# Patient Record
Sex: Male | Born: 1985
Health system: Southern US, Community
[De-identification: ages and names within clinical notes are randomized; demographics above are authoritative.]

## PROBLEM LIST (undated history)

## (undated) DIAGNOSIS — I10 Essential (primary) hypertension: Secondary | ICD-10-CM

## (undated) DIAGNOSIS — F419 Anxiety disorder, unspecified: Secondary | ICD-10-CM

## (undated) DIAGNOSIS — F32A Depression, unspecified: Secondary | ICD-10-CM

## (undated) HISTORY — DX: Essential (primary) hypertension: I10

## (undated) HISTORY — DX: Depression, unspecified: F32.A

## (undated) HISTORY — DX: Anxiety disorder, unspecified: F41.9

---
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
## 2005-10-04 ENCOUNTER — Ambulatory Visit: Payer: Self-pay | Admitting: Internal Medicine

## 2007-03-30 ENCOUNTER — Ambulatory Visit: Payer: Self-pay | Admitting: Internal Medicine

## 2007-03-30 DIAGNOSIS — J019 Acute sinusitis, unspecified: Secondary | ICD-10-CM | POA: Insufficient documentation

## 2007-03-30 LAB — CONVERTED CEMR LAB: Rapid Strep: NEGATIVE

## 2007-05-05 DIAGNOSIS — S060XAA Concussion with loss of consciousness status unknown, initial encounter: Secondary | ICD-10-CM | POA: Insufficient documentation

## 2007-05-05 DIAGNOSIS — S060X9A Concussion with loss of consciousness of unspecified duration, initial encounter: Secondary | ICD-10-CM | POA: Insufficient documentation

## 2007-06-04 ENCOUNTER — Ambulatory Visit: Payer: Self-pay | Admitting: Internal Medicine

## 2007-06-05 ENCOUNTER — Ambulatory Visit (HOSPITAL_COMMUNITY): Admission: RE | Admit: 2007-06-05 | Discharge: 2007-06-05 | Payer: Self-pay | Admitting: Internal Medicine

## 2009-07-16 IMAGING — CT CT HEAD W/O CM
1 series · 16 of 30 positions shown, 20 images · non-contrast
Comparison: None

CLINICAL DATA: Closed head injury one - 2 months ago.  Intermittent
left sided headache.  Assess for subdural hematoma.

CT HEAD WITHOUT CONTRAST
TECHNIQUE: Contiguous axial images were obtained from the base of
the skull through the vertex without contrast.

[Series 2: head routine 4.8 h37s · axial · 0.48mm/px · z∈[-173,-13]mm · 16 of 36 slices shown, 20 images]
[im 2/36  brain]
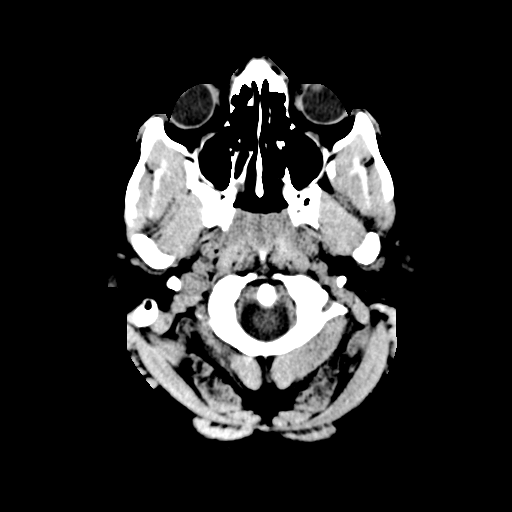
[im 2/36  bone]
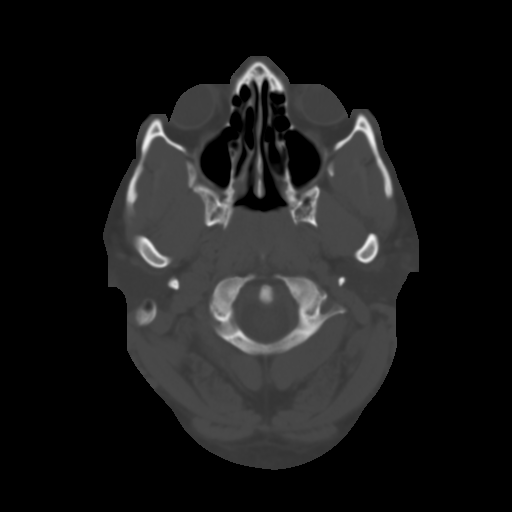
[im 4/36  brain]
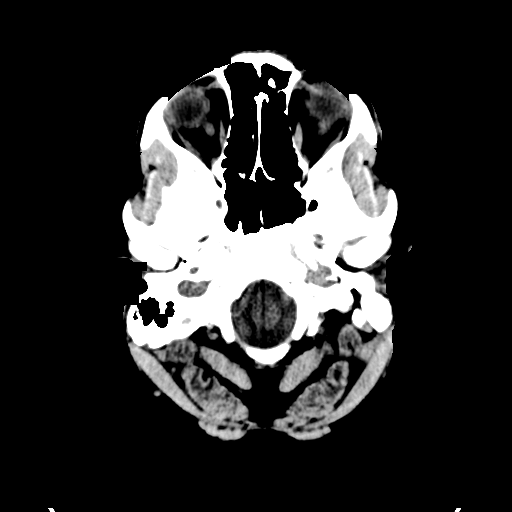
[im 7/36  brain]
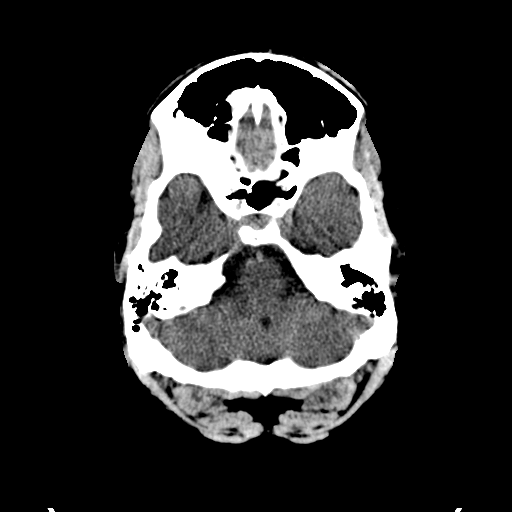
[im 9/36  brain]
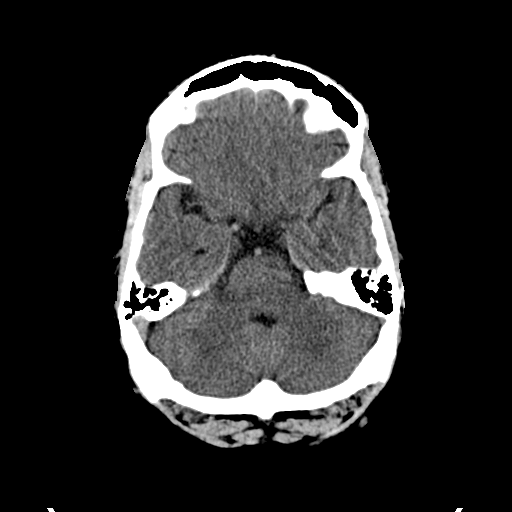
[im 10/36  brain]
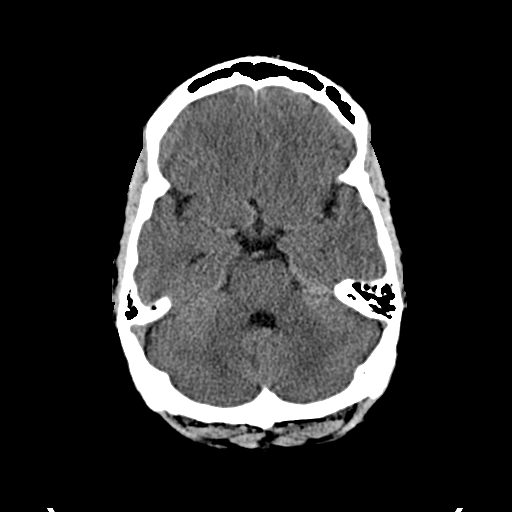
[im 10/36  bone]
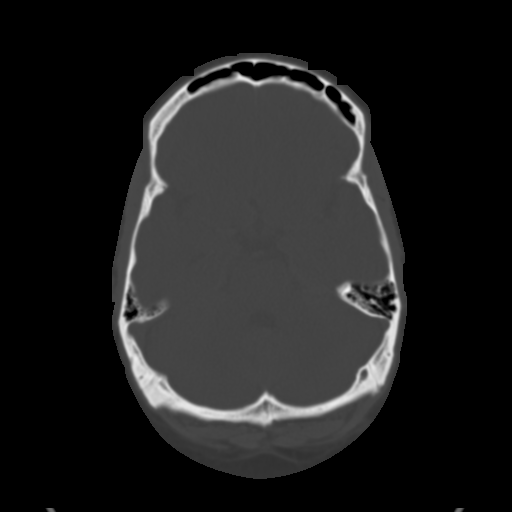
[im 13/36  brain]
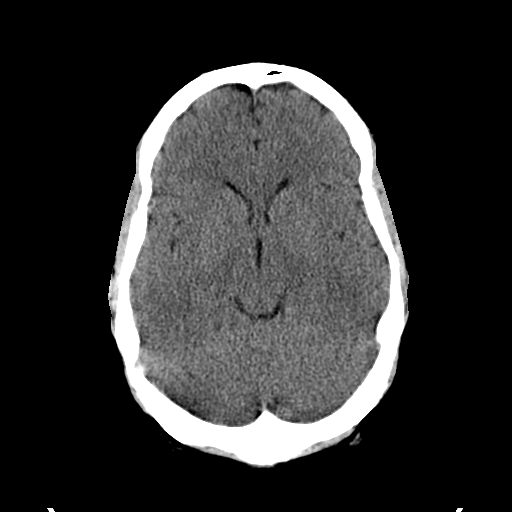
[im 15/36  brain]
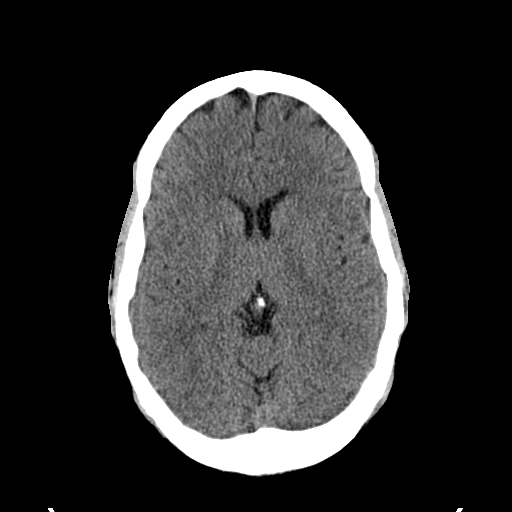
[im 17/36  brain]
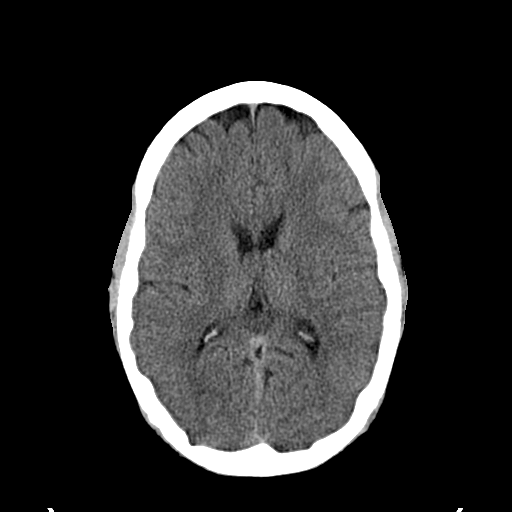
[im 19/36  brain]
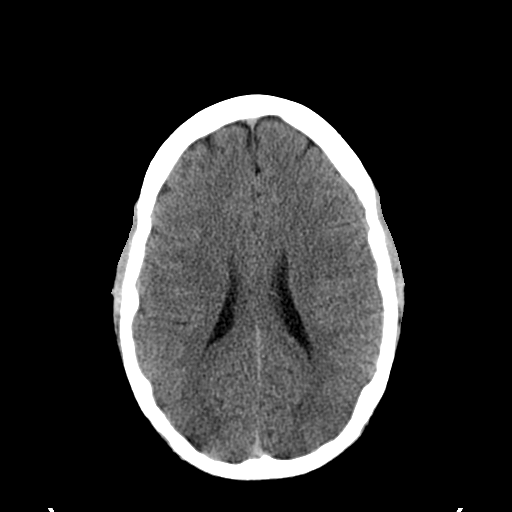
[im 19/36  bone]
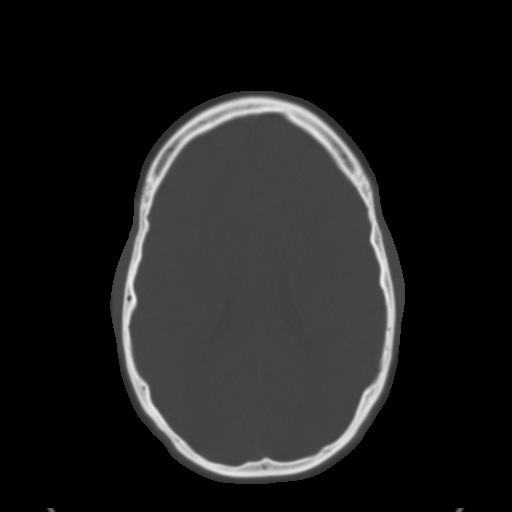
[im 21/36  brain]
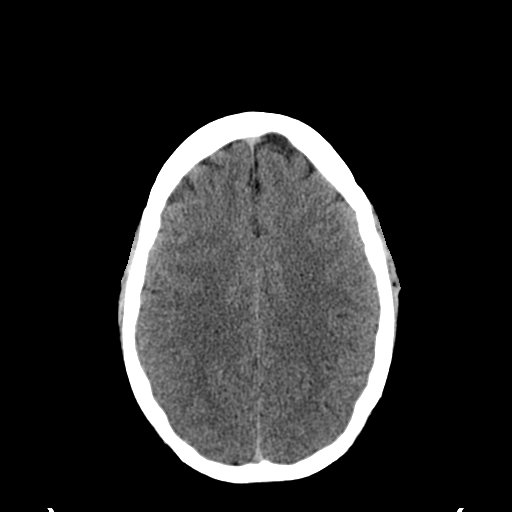
[im 23/36  brain]
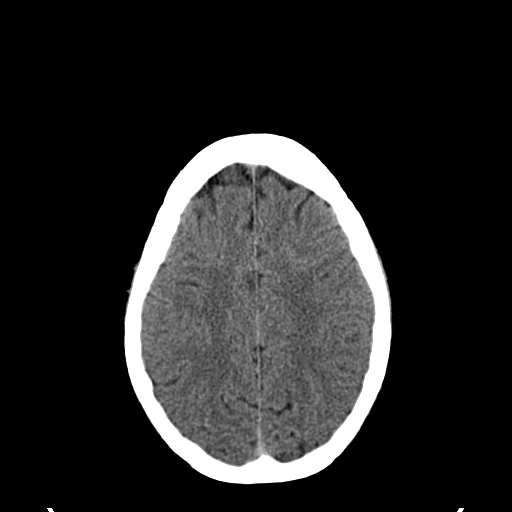
[im 26/36  brain]
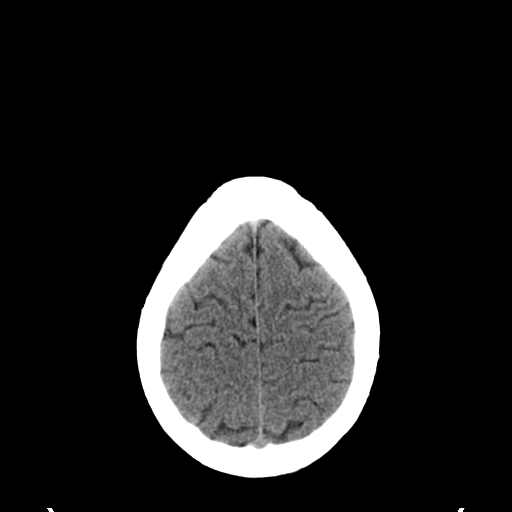
[im 27/36  brain]
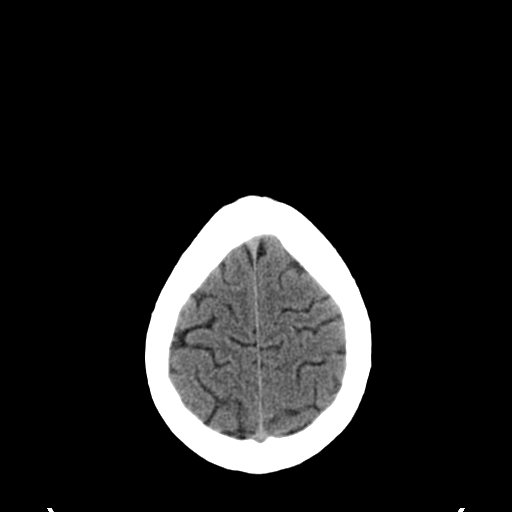
[im 27/36  bone]
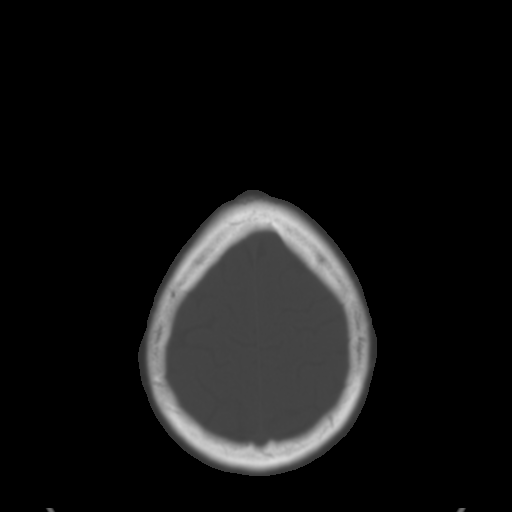
[im 29/36  brain]
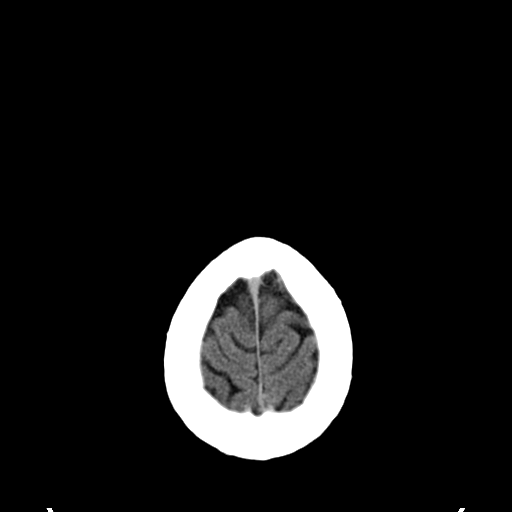
[im 32/36  brain]
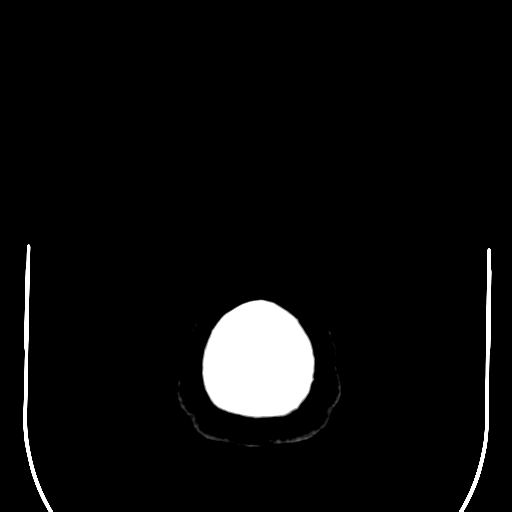
[im 34/36  brain]
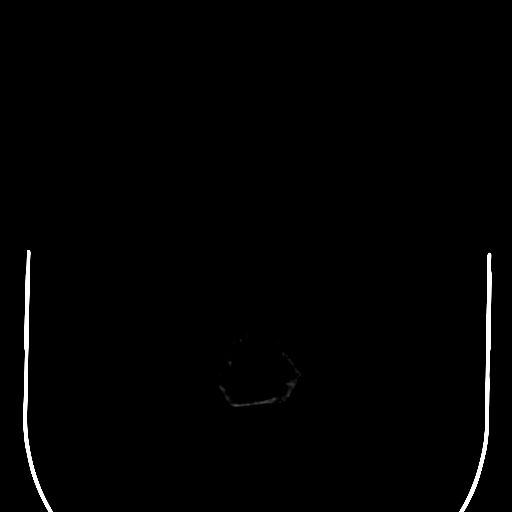

[16 of 30 positions shown; findings below may reference images not displayed]

FINDINGS: The brain has a normal appearance without evidence of
infarction, mass lesion, hemorrhage, hydrocephalus or extra-axial
collection.  The calvarium is unremarkable.  The sinuses are clear.
IMPRESSION: Normal examination

## 2009-08-12 ENCOUNTER — Ambulatory Visit: Payer: Self-pay | Admitting: Internal Medicine

## 2009-08-12 DIAGNOSIS — G479 Sleep disorder, unspecified: Secondary | ICD-10-CM | POA: Insufficient documentation

## 2010-01-25 ENCOUNTER — Ambulatory Visit: Payer: Self-pay | Admitting: Internal Medicine

## 2010-01-27 ENCOUNTER — Ambulatory Visit: Payer: Self-pay | Admitting: Internal Medicine

## 2010-04-06 NOTE — Assessment & Plan Note (Signed)
Summary: FLU SHOT/JRR  Nurse Visit   Allergies: No Known Drug Allergies  Orders Added: 1)  Admin 1st Vaccine [90471] 2)  Flu Vaccine 9yrs + [16109]  Flu Vaccine Consent Questions     Do you have a history of severe allergic reactions to this vaccine? no    Any prior history of allergic reactions to egg and/or gelatin? no    Do you have a sensitivity to the preservative Thimersol? no    Do you have a past history of Guillan-Barre Syndrome? no    Do you currently have an acute febrile illness? no    Have you ever had a severe reaction to latex? no    Vaccine information given and explained to patient? yes    Are you currently pregnant? no    Lot Number:[id number]   Exp Date:09/04/2010   Site Given  Right Deltoid IM

## 2010-04-06 NOTE — Assessment & Plan Note (Signed)
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  Past Medical History: Reviewed history from 08/12/2009 and no changes required. Unremarkable  Past Surgical History: Reviewed history from 03/30/2007 and no changes required. 2/04 Breast reduction  Family History: Reviewed history from 03/30/2007 and no changes required. DM very strong  Breast cancer on Mom's side Sister with asthma  Social History: Patrents married--work at [employer] 2 sibs Never Smoked Working at [employer] as a Administrator, Civil Service:  weight went up over 10# since the last visit Sleep is better now---more regular hours wears seat belt. Eyes:  Denies double vision and vision loss-1 eye. ENT:  Complains of ringing in ears; denies decreased hearing; brief occ tinnitus teeth okay-- overdue for dentist had sore on right inner cheek---seems to be improving. CV:  Complains of shortness of breath with exertion; denies chest pain or discomfort, difficulty breathing at night, difficulty breathing while lying down, fainting, lightheadness, and palpitations; got very dyspneic trying to play basketball. Resp:  Denies cough and shortness of breath. GI:  Complains of indigestion;  denies abdominal pain, bloody stools, change in bowel habits, dark tarry stools, nausea, and vomiting; occ gets burning in throat after eating---generally hasn't needed meds. GU:  Denies erectile dysfunction, urinary frequency, and urinary hesitancy; discussed safe sex--- no unprotected sex. MS:  Denies joint pain and joint swelling. Derm:  Denies lesion(s) and rash. Neuro:  Complains of headaches; denies numbness, tingling, and weakness; occ headache in bed--if he sleeps on his back with head pressure--improves with change in postiion. Psych:  Denies anxiety and depression. Endo:  Denies cold intolerance, excessive thirst, excessive urination, and heat intolerance. Heme:  Denies abnormal bruising and enlarge lymph nodes. Allergy:  Denies seasonal allergies and sneezing.  Physical Exam  General:  alert and normal appearance.   Eyes:  pupils equal, pupils round, pupils reactive to light, and no optic disk abnormalities.   Ears:  R ear normal and L ear normal.   Mouth:  no erythema, no exudates, and no lesions.   Neck:  supple, no masses, no thyromegaly, and no cervical lymphadenopathy.   Lungs:  normal respiratory effort, no intercostal retractions, no accessory muscle use, and normal breath sounds.   Heart:  normal rate, regular rhythm, no murmur, and no gallop.   Abdomen:  soft, non-tender, and no masses.   Genitalia:  no varicocele, no scrotal masses, no testicular masses or atrophy, and no urethral discharge.   Msk:  no joint tenderness and no joint swelling.   Pulses:  1+ in feet Extremities:  no edema Neurologic:  strength normal in all extremities and gait normal.   Skin:  no rashes and no suspicious lesions.   Axillary Nodes:  No palpable lymphadenopathy Psych:  normally interactive, good eye contact, not anxious appearing, and not depressed appearing.     Impression & Recommendations:  Problem # 1:  PREVENTIVE HEALTH CARE (ICD-V70.0) Assessment Comment Only healthy but out  of shape counselled on giving up sugared drinks and getting into regular exercise schedule  Tdap  Other Orders: Tdap => 70yrs IM (25366) Admin 1st Vaccine (44034)  Patient Instructions: 1)  Please schedule a follow-up appointment in 2-3 years 2)  Please consider following the [employer]   Orders Added: 1)  Est. Patient 18-39 years [99395] 2)  Tdap => 73yrs IM [90715] 3)  Admin 1st Vaccine [90471]   Immunizations Administered:  Tetanus Vaccine:    Vaccine Type: Tdap    Site: left deltoid    Mfr: GlaxoSmithKline    Dose: 0.5 ml    Route: IM    Given by: Mervin Hack CMA (AAMA)    Exp. Date: 12/25/2011    Lot #: [id number]    VIS given: 01/23/08 version given January 25, 2010.   Immunizations Administered:  Tetanus Vaccine:    Vaccine Type: Tdap    Site: left deltoid    Mfr: GlaxoSmithKline    Dose: 0.5 ml    Route: IM    Given by: Mervin Hack CMA (AAMA)    Exp. Date: 12/25/2011    Lot #: [id number]    VIS given: 01/23/08 version given January 25, 2010.  Prior Medications: None Current Allergies (reviewed today): No known allergies

## 2010-04-06 NOTE — Assessment & Plan Note (Signed)
Summary: 8:15 TROUBLE SLEEPING/CLE   Vital Signs:  Patient profile:   25 year old male Height:      70.5 inches Weight:      327 pounds BMI:     46.42 Temp:     98.5 degrees F oral Pulse rate:   80 / minute Pulse rhythm:   regular BP sitting:   116 / 80  (left arm) Cuff size:   large  Vitals Entered By: Mervin Hack CMA Duncan Dull) (August 12, 2009 8:13 AM) CC: trouble sleeping   History of Present Illness: Having sleep problems difficult schedule with school---early class as school, then gap and then back after lunch He goes back to sleep after 8AM class Then can't initiate until 2-3 AM  Usually works in evenings Some trouble with somnolence at 8AM class but not later one no tiredness at work  Is a snorer no known apnea awakens somewhat tired  On weekends he will stay up late (like 2AM), then will sleep into the afternoon occ feels tired then and may have headache at times Does feel okay then   Has occ feelings of depression Usually no more than a few hours Not particularly anxious--does worry about lack of career.  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  Past Medical History: Unremarkable  Past Surgical History: Reviewed history from 03/30/2007 and no changes required. 2/04 Breast reduction  Family History: Reviewed history from 03/30/2007 and no changes required. DM very strong  Breast cancer on Mom's side Sister with asthma  Social History: Patrents  married--work at [employer] 2 sibs Never Smoked School at [employer] Works at [employer]  Review of Systems  The patient denies chest pain, syncope, dyspnea on exertion, and abdominal pain.         weight up 18# in 2 years Occ left shoulder twinges of pain  Physical Exam  General:  alert and normal appearance.   Neck:  supple, no masses, no thyromegaly, no carotid bruits, and no cervical lymphadenopathy.   Lungs:  normal respiratory effort and normal breath sounds.   Heart:  normal rate, regular rhythm, no murmur, and no gallop.   Abdomen:  soft and non-tender.   Extremities:  no edema Psych:  normally interactive, good eye contact, not anxious appearing, and not depressed appearing.     Impression & Recommendations:  Problem # 1:  UNSPECIFIED SLEEP DISTURBANCE (ICD-780.50) Assessment New no sig affective problems clearly seems to be a sleep hygiene issue discussed no napping (esp the 2-3 hours in AM he was doing) needs to not oversleep on weekends (8-10 hours is enough)  if not improving, will consider trazodone but I don't think this should be necessary  Patient Instructions: 1)  Please schedule a follow-up appointment as needed .   Prior Medications: None Current Allergies (reviewed today): No known allergies

## 2010-07-23 NOTE — H&P (Signed)
   Jacob Flowers, Jacob Flowers                        ACCOUNT NO.:  [id number]   MEDICAL RECORD NO.:  [id number]                   PATIENT TYPE:  AMB   LOCATION:  [facility]                                  FACILITY:  [facility]   PHYSICIAN:  Earvin Hansen L. Shon Hough, M.D.           DATE OF BIRTH:  1986/01/25   DATE OF ADMISSION:  05/06/2002  DATE OF DISCHARGE:                                HISTORY & PHYSICAL   ADDENDUM:   ASSISTANT SURGEON:  Alethia Berthold, CFA                                               Yaakov Guthrie. Shon Hough, M.D.    Cathie Hoops  D:  05/06/2002  T:  05/06/2002  Job:  [id number]

## 2010-07-23 NOTE — Op Note (Signed)
   NAMEJATIN, Jacob Flowers                        ACCOUNT NO.:  [id number]   MEDICAL RECORD NO.:  [id number]                   PATIENT TYPE:  AMB   LOCATION:  [facility]                                  FACILITY:  [facility]   PHYSICIAN:  Earvin Hansen L. Shon Hough, M.D.           DATE OF BIRTH:  10-Apr-1985   DATE OF PROCEDURE:  05/06/2002  DATE OF DISCHARGE:                                 OPERATIVE REPORT   OPERATION:  Bilateral gynecomastia excision with subcutaneous mastectomies.   SURGEON:  Yaakov Guthrie. Shon Hough, M.D.   ANESTHESIA:  General   INDICATIONS FOR PROCEDURE:  This is a 25 year old with severe gynecomastia  size C to D cup of breast tissue.  Previous work ups are negative.   DESCRIPTION OF PROCEDURE:  The patient was taken to the operating room and  placed on the operating table in the supine position, was given adequate  general anesthesia and intubated orally.  Preoperatively, the patient was  set up and drawn for all of the centralized breast tissue with the increased  gynecomastia as well as into the talar space of the axillary regions.  Tumescent solution was injected at 500 cc per side and was allowed to set  up.  After this, periareolar incision was made from 3:00 to 9:00 position  and excision of dense fibrous tissue was removed around the central parts of  the breast tissue using the Mayo scissors.  Hemostasis using Bovie  electrocoagulation.  Irrigation was done.  The Lacril suction was fashioned  to the periphery and axillary regions removing large volume of accessory  breast tissue and gynecomastia.  After final hemostasis, irrigation was  done.  The wounds were packed lightly with wet laps and allowed to sit.  After this, again we checked for hemostasis.  With good hemostasis, we then  closed the wounds with 2-0 Monocryl x 2 layers in a running subcuticular  stitch of 3-0 Monocryl.  The wounds were drained with a #10 fully fluted  flat Blake drain which was placed in the  depths of the wound and brought out  through the lateral most portion of the incision and secured with 3-0  Prolene.  The wounds were cleansed.  Sterile dressings were applied to all  areas.  At the end of the procedure, all the nipple areolar complexes were  supple.  He withstood the procedure very well and was taken to recovery in  excellent condition.                                                Yaakov Guthrie. Shon Hough, M.D.    Cathie Hoops  D:  05/06/2002  T:  05/06/2002  Job:  [id number]

## 2013-04-22 ENCOUNTER — Ambulatory Visit (INDEPENDENT_AMBULATORY_CARE_PROVIDER_SITE_OTHER): Payer: Self-pay | Admitting: Internal Medicine

## 2013-04-22 ENCOUNTER — Encounter: Payer: Self-pay | Admitting: Internal Medicine

## 2013-04-22 ENCOUNTER — Ambulatory Visit: Payer: Self-pay | Admitting: Internal Medicine

## 2013-04-22 VITALS — BP 130/80 | HR 96 | Temp 98.0°F | Wt 335.0 lb

## 2013-04-22 DIAGNOSIS — J019 Acute sinusitis, unspecified: Secondary | ICD-10-CM

## 2013-04-22 MED ORDER — AMOXICILLIN 500 MG PO TABS: 1000.0000 mg | ORAL_TABLET | Freq: Two times a day (BID) | ORAL | Status: DC

## 2013-04-22 NOTE — Progress Notes (Signed)
   Subjective:    Patient ID: Jacob Flowers, male    DOB: 11-12-1985, 28 y.o.   MRN: [id number]  HPI Hasn't been seen in a few years Reviewed and updated PMH  Having nasal congestion Sore throat Started with rhinorrhea--tried mucinex Seemed to worsen after a while Bad post nasal drip and is blocked in nose Some cough--from PND  Now with some blood from nose No fever No sweats but did have some brief chills No ear pain No sig SOB  Started a few days ago Only used the mucinex  No current outpatient prescriptions on file prior to visit.   No current facility-administered medications on file prior to visit.    No Known Allergies  No past medical history on file.  Past Surgical History  Procedure Laterality Date  . Breast reduction surgery  2/04    Family History  Problem Relation Age of Onset  . Diabetes Father   . Asthma Sister   . Heart disease Neg Hx   . Diabetes Other   . Cancer Other     breast cancer    History   Social History  . Marital Status: Single    Spouse Name: N/A    Number of Children: 0  . Years of Education: N/A   Occupational History  . [employer]    Social History Main Topics  . Smoking status: Current Some Day Smoker  . Smokeless tobacco: Never Used     Comment: rare cigarette  . Alcohol Use: Yes  . Drug Use: No  . Sexual Activity: Not on file   Other Topics Concern  . Not on file   Social History Narrative  . No narrative on file   Review of Systems No rash No diarrhea Some nausea this AM--- happens with drainage and cough.  Appetite is okay--but not great    Objective:   Physical Exam  Constitutional: He appears well-developed and well-nourished. No distress.  HENT:  No sinus tenderness TMs normal Left nare obstructed, right moderate inflammation Mild pharyngeal injection without exudates---tonsils still ~2+  Neck: Normal range of motion. Neck supple.  Small nontender anterior cervical nodes    Cardiovascular: Normal rate, regular rhythm and normal heart sounds.  Exam reveals no gallop.   No murmur heard. Pulmonary/Chest: Effort normal and breath sounds normal. No respiratory distress. He has no wheezes. He has no rales.  Musculoskeletal: He exhibits no edema.          Assessment & Plan:

## 2013-04-22 NOTE — Patient Instructions (Signed)
Please start the antibiotic if you are worsening over the next few days.  DASH Diet The DASH diet stands for "Dietary Approaches to Stop Hypertension." It is a healthy eating plan that has been shown to reduce high blood pressure (hypertension) in as little as 14 days, while also possibly providing other significant health benefits. These other health benefits include reducing the risk of breast cancer after menopause and reducing the risk of type 2 diabetes, heart disease, colon cancer, and stroke. Health benefits also include weight loss and slowing kidney failure in patients with chronic kidney disease.  DIET GUIDELINES  Limit salt (sodium). Your diet should contain less than 1500 mg of sodium daily.  Limit refined or processed carbohydrates. Your diet should include mostly whole grains. Desserts and added sugars should be used sparingly.  Include small amounts of heart-healthy fats. These types of fats include nuts, oils, and tub margarine. Limit saturated and trans fats. These fats have been shown to be harmful in the body. CHOOSING FOODS  The following food groups are based on a 2000 calorie diet. See your Registered Dietitian for individual calorie needs. Grains and Grain Products (6 to 8 servings daily)  Eat More Often: Whole-wheat bread, brown rice, whole-grain or wheat pasta, quinoa, popcorn without added fat or salt (air popped).  Eat Less Often: White bread, white pasta, white rice, cornbread. Vegetables (4 to 5 servings daily)  Eat More Often: Fresh, frozen, and canned vegetables. Vegetables may be raw, steamed, roasted, or grilled with a minimal amount of fat.  Eat Less Often/Avoid: Creamed or fried vegetables. Vegetables in a cheese sauce. Fruit (4 to 5 servings daily)  Eat More Often: All fresh, canned (in natural juice), or frozen fruits. Dried fruits without added sugar. One hundred percent fruit juice ( cup [237 mL] daily).  Eat Less Often: Dried fruits with added  sugar. Canned fruit in light or heavy syrup. Foot LockerLean Meats, Fish, and Poultry (2 servings or less daily. One serving is 3 to 4 oz [85-114 g]).  Eat More Often: Ninety percent or leaner ground beef, tenderloin, sirloin. Round cuts of beef, chicken breast, Mal[place]key breast. All fish. Grill, bake, or broil your meat. Nothing should be fried.  Eat Less Often/Avoid: Fatty cuts of meat, Mal[place]key, or chicken leg, thigh, or wing. Fried cuts of meat or fish. Dairy (2 to 3 servings)  Eat More Often: Low-fat or fat-free milk, low-fat plain or light yogurt, reduced-fat or part-skim cheese.  Eat Less Often/Avoid: Milk (whole, 2%).Whole milk yogurt. Full-fat cheeses. Nuts, Seeds, and Legumes (4 to 5 servings per week)  Eat More Often: All without added salt.  Eat Less Often/Avoid: Salted nuts and seeds, canned beans with added salt. Fats and Sweets (limited)  Eat More Often: Vegetable oils, tub margarines without trans fats, sugar-free gelatin. Mayonnaise and salad dressings.  Eat Less Often/Avoid: Coconut oils, palm oils, butter, stick margarine, cream, half and half, cookies, candy, pie. FOR MORE INFORMATION The Dash Diet Eating Plan: www.dashdiet.org Document Released: 02/10/2011 Document Revised: 05/16/2011 Document Reviewed: 02/10/2011 [facility] Patient Information 2014 [employer] [place], [place].

## 2013-04-22 NOTE — Progress Notes (Signed)
Pre-visit discussion using our clinic review tool. No additional management support is needed unless otherwise documented below in the visit note.  

## 2013-04-22 NOTE — Assessment & Plan Note (Signed)
May still be viral Discussed supportive care  If worsens, will start amoxicillin

## 2019-11-01 ENCOUNTER — Ambulatory Visit: Payer: Self-pay

## 2019-11-20 ENCOUNTER — Other Ambulatory Visit: Payer: Self-pay

## 2019-11-20 ENCOUNTER — Emergency Department
Admission: EM | Admit: 2019-11-20 | Discharge: 2019-11-21 | Disposition: A | Discharge status: Home / Self Care | Payer: Self-pay | Attending: Emergency Medicine | Admitting: Emergency Medicine

## 2019-11-20 DIAGNOSIS — H6501 Acute serous otitis media, right ear: Secondary | ICD-10-CM | POA: Insufficient documentation

## 2019-11-20 DIAGNOSIS — F17200 Nicotine dependence, unspecified, uncomplicated: Secondary | ICD-10-CM | POA: Insufficient documentation

## 2019-11-20 MED ORDER — CARBAMIDE PEROXIDE 6.5 % OT SOLN
5.0000 [drp] | Freq: Once | OTIC | Status: AC
  Administered 2019-11-20: 5 [drp] via OTIC
  Filled 2019-11-20: qty 15

## 2019-11-20 MED ORDER — FLUTICASONE PROPIONATE 50 MCG/ACT NA SUSP: 1.0000 | Freq: Every day | NASAL | 0 refills | Status: DC

## 2019-11-20 MED ORDER — PREDNISONE 10 MG PO TABS: 40.0000 mg | ORAL_TABLET | Freq: Every day | ORAL | 0 refills | Status: AC

## 2019-11-20 NOTE — ED Provider Notes (Signed)
[facility] Emergency Department Provider Note  ____________________________________________   First MD Initiated Contact with Patient 11/20/19 2106     (approximate)  I have reviewed the triage vital signs and the nursing notes.   HISTORY  Chief Complaint Tinnitus and Otalgia   HPI Jacob Flowers is a 34 y.o. male who presents to the emergency department for ringing in his right ear with mild pain x5 days.  He states it feels like there is something in his ear.  The patient denies sticking anything in his ear.  The patient does have a history of allergic rhinitis and feels that he does have some nasal congestion and sinus pressure that has been bothering him for approximately the same amount of time.  He denies fever.        History reviewed. No pertinent past medical history.  Patient Active Problem List   Diagnosis Date Noted  . SINUSITIS- ACUTE-NOS 03/30/2007    Past Surgical History:  Procedure Laterality Date  . BREAST REDUCTION SURGERY  2/04    Prior to Admission medications   Medication Sig Start Date End Date Taking? Authorizing Provider  amoxicillin (AMOXIL) 500 MG tablet Take 2 tablets (1,000 mg total) by mouth 2 (two) times daily. 04/22/13   Karie Schwalbe, MD  fluticasone (FLONASE) 50 MCG/ACT nasal spray Place 1 spray into both nostrils daily. 11/20/19 12/20/19  Lucy Chris, PA  predniSONE (DELTASONE) 10 MG tablet Take 4 tablets (40 mg total) by mouth daily for 3 days. 11/20/19 11/23/19  Lucy Chris, PA    Allergies Patient has no known allergies.  Family History  Problem Relation Age of Onset  . Diabetes Father   . Asthma Sister   . Heart disease Neg Hx   . Diabetes Other   . Cancer Other        breast cancer    Social History Social History   Tobacco Use  . Smoking status: Current Some Day Smoker  . Smokeless tobacco: Never Used  . Tobacco comment: rare cigarette  Substance Use Topics  . Alcohol use:  Yes  . Drug use: No    Review of Systems Constitutional: No fever/chills Eyes: No visual changes. ENT: No sore throat. + Nasal congestion, + ear pain, positive tenderness Cardiovascular: Denies chest pain. Respiratory: Denies shortness of breath. Gastrointestinal: No abdominal pain.  No nausea, no vomiting.  No diarrhea.  No constipation. Genitourinary: Negative for dysuria. Musculoskeletal: Negative for back pain. Skin: Negative for rash. Neurological: Negative for headaches, focal weakness or numbness.  ____________________________________________   PHYSICAL EXAM:  VITAL SIGNS: ED Triage Vitals  Enc Vitals Group     BP 11/20/19 1923 128/89     Pulse Rate 11/20/19 1923 89     Resp 11/20/19 1923 20     Temp 11/20/19 1923 98.8 F (37.1 C)     Temp Source 11/20/19 1923 Oral     SpO2 11/20/19 1923 98 %     Weight 11/20/19 1925 300 lb (136.1 kg)     Height 11/20/19 1925 5\' 11"  (1.803 m)     Head Circumference --      Peak Flow --      Pain Score 11/20/19 1925 8     Pain Loc --      Pain Edu? --      Excl. in GC? --     Constitutional: Alert and oriented. Well appearing and in no acute distress. Eyes: Conjunctivae are normal. PERRL. EOMI.  Head: Atraumatic. Nose: + congestion/rhinnorhea. Ears: The left TM is visualized with fluid but no erythema.  The right TM is initially unable to be visualized secondary to cerumen.  Repeat examination following Debrox and rinse treatment reveals a intact TM with serous fluid with bubbles behind the TM.  There is no erythema or bulging. Mouth/Throat: Mucous membranes are moist.  Oropharynx non-erythematous. Neck: No stridor.   Cardiovascular: Normal rate, regular rhythm. Grossly normal heart sounds.  Good peripheral circulation. Respiratory: Normal respiratory effort.  No retractions. Lungs CTAB. Neurologic:  Normal speech and language. No gross focal neurologic deficits are appreciated. No gait instability. Skin:  Skin is warm, dry and  intact. No rash noted. Psychiatric: Mood and affect are normal. Speech and behavior are normal.   ____________________________________________   INITIAL IMPRESSION / ASSESSMENT AND PLAN / ED COURSE  As part of my medical decision making, I reviewed the following data within the electronic MEDICAL RECORD NUMBER Nursing notes reviewed and incorporated and Notes from prior ED visits        Jacob Flowers is a 34 year old male who presents to the emergency department for evaluation of right ear tinnitus and mild pain.  He feels that there is something blocking the ear.  He does have a history of allergic rhinitis and does feel he has nasal congestion at this time.  Physical exam is initially difficult to obtain of the right ear secondary to ear canal debris.  This is removed with Debrox and saline irrigation.  Repeat visualization reveals a intact TM with serous fluid posterior to the TM.  There is no erythema or suggestion of infection and the patient is afebrile.  Given this, will treat the patient with fluticasone nasal spray as well as a 3-day burst of prednisone.  The patient is amenable with this plan and will return for any worsening symptoms or will present to his primary care for persistent symptoms.      ____________________________________________   FINAL CLINICAL IMPRESSION(S) / ED DIAGNOSES  Final diagnoses:  Non-recurrent acute serous otitis media of right ear     ED Discharge Orders         Ordered    fluticasone (FLONASE) 50 MCG/ACT nasal spray  Daily        11/20/19 2339    predniSONE (DELTASONE) 10 MG tablet  Daily        11/20/19 2339          *Please note:  Jacob Flowers was evaluated in Emergency Department on 11/20/2019 for the symptoms described in the history of present illness. He was evaluated in the context of the global COVID-19 pandemic, which necessitated consideration that the patient might be at risk for infection with the SARS-CoV-2 virus that causes  COVID-19. Institutional protocols and algorithms that pertain to the evaluation of patients at risk for COVID-19 are in a state of rapid change based on information released by regulatory bodies including the CDC and federal and state organizations. These policies and algorithms were followed during the patient's care in the ED.  Some ED evaluations and interventions may be delayed as a result of limited staffing during and the pandemic.*   Note:  This document was prepared using Dragon voice recognition software and may include unintentional dictation errors.    Lucy Chris, PA 11/20/19 2353    Gilles Chiquito, MD 11/21/19 [phone]

## 2019-11-20 NOTE — ED Triage Notes (Signed)
PT to ED c/o ear pain and ringing for 5 days. PT states it feels like something may be in there but he does not know. Scant amount of yellow discharge.

## 2020-08-28 ENCOUNTER — Emergency Department: Payer: 59

## 2020-08-28 ENCOUNTER — Other Ambulatory Visit: Payer: Self-pay

## 2020-08-28 ENCOUNTER — Emergency Department
Admission: EM | Admit: 2020-08-28 | Discharge: 2020-08-28 | Disposition: A | Discharge status: Home / Self Care | Payer: 59 | Attending: Emergency Medicine | Admitting: Emergency Medicine

## 2020-08-28 DIAGNOSIS — I10 Essential (primary) hypertension: Secondary | ICD-10-CM | POA: Insufficient documentation

## 2020-08-28 DIAGNOSIS — F17200 Nicotine dependence, unspecified, uncomplicated: Secondary | ICD-10-CM | POA: Insufficient documentation

## 2020-08-28 DIAGNOSIS — U071 COVID-19: Secondary | ICD-10-CM

## 2020-08-28 MED ORDER — BENZONATATE 100 MG PO CAPS: 100.0000 mg | ORAL_CAPSULE | Freq: Three times a day (TID) | ORAL | 0 refills | Status: AC | PRN

## 2020-08-28 NOTE — ED Notes (Signed)
Discharge instructions reviewed with pt. Pt calm collective , denied pain or sob .

## 2020-08-28 NOTE — ED Triage Notes (Signed)
Pt here with SOB, tested postive for covid on Wed. Pt also is hot at times, and has a headache. Pt stable in triage.

## 2020-08-28 NOTE — ED Provider Notes (Signed)
[facility] Emergency Department Provider Note  ____________________________________________   Event Date/Time   First MD Initiated Contact with Patient 08/28/20 1351     (approximate)  I have reviewed the triage vital signs and the nursing notes.   HISTORY  Chief Complaint Shortness of Breath   HPI Jacob Flowers is a 35 y.o. male with no significant past medical history who presents for assessment of approximately 8 days of some shortness of breath cough and congestion associate with some mild headaches since recently taking a home COVID test that was positive.  Patient states he sometimes has a little bit of tightness in his chest when he is coughing a lot but not in between.  No fevers, vomiting but does endorse a little diarrhea.  No acute abdominal pain, back pain, urinary symptoms, rash or recent injuries or falls.  No tobacco abuse EtOH use or illicit drug use.  No other acute concerns at this time.         No past medical history on file.  Patient Active Problem List   Diagnosis Date Noted   SINUSITIS- ACUTE-NOS 03/30/2007    Past Surgical History:  Procedure Laterality Date   BREAST REDUCTION SURGERY  2/04    Prior to Admission medications   Medication Sig Start Date End Date Taking? Authorizing Provider  benzonatate (TESSALON PERLES) 100 MG capsule Take 1 capsule (100 mg total) by mouth 3 (three) times daily as needed for up to 5 days for cough. 08/28/20 09/02/20 Yes Gilles Chiquito, MD  fluticasone (FLONASE) 50 MCG/ACT nasal spray Place 1 spray into both nostrils daily. 11/20/19 12/20/19  Lucy Chris, PA    Allergies Patient has no known allergies.  Family History  Problem Relation Age of Onset   Diabetes Father    Asthma Sister    Heart disease Neg Hx    Diabetes Other    Cancer Other        breast cancer    Social History Social History   Tobacco Use   Smoking status: Some Days    Pack years: 0.00   Smokeless  tobacco: Never   Tobacco comments:    rare cigarette  Substance Use Topics   Alcohol use: Yes   Drug use: No    Review of Systems  Review of Systems  Constitutional:  Positive for chills and malaise/fatigue. Negative for fever.  HENT:  Positive for congestion. Negative for sore throat.   Eyes:  Negative for pain.  Respiratory:  Positive for cough and shortness of breath (intermittently w/ exertion). Negative for stridor.   Cardiovascular:  Negative for chest pain.  Gastrointestinal:  Negative for vomiting.  Genitourinary:  Negative for dysuria.  Musculoskeletal:  Positive for myalgias.  Skin:  Negative for rash.  Neurological:  Negative for seizures, loss of consciousness and headaches.  Psychiatric/Behavioral:  Negative for suicidal ideas.   All other systems reviewed and are negative.    ____________________________________________   PHYSICAL EXAM:  VITAL SIGNS: ED Triage Vitals  Enc Vitals Group     BP 08/28/20 1243 (!) 155/106     Pulse Rate 08/28/20 1241 78     Resp 08/28/20 1241 18     Temp 08/28/20 1241 98.4 F (36.9 C)     Temp src --      SpO2 08/28/20 1241 100 %     Weight 08/28/20 1241 300 lb (136.1 kg)     Height 08/28/20 1241 5\' 11"  (1.803 m)  Head Circumference --      Peak Flow --      Pain Score 08/28/20 1241 7     Pain Loc --      Pain Edu? --      Excl. in GC? --    Vitals:   08/28/20 1241 08/28/20 1243  BP:  (!) 155/106  Pulse: 78   Resp: 18   Temp: 98.4 F (36.9 C)   SpO2: 100%    Physical Exam Vitals and nursing note reviewed.  Constitutional:      Appearance: He is well-developed. He is obese.  HENT:     Head: Normocephalic and atraumatic.     Right Ear: External ear normal.     Left Ear: External ear normal.     Nose: Nose normal.     Mouth/Throat:     Mouth: Mucous membranes are moist.  Eyes:     Conjunctiva/sclera: Conjunctivae normal.  Cardiovascular:     Rate and Rhythm: Normal rate and regular rhythm.     Heart  sounds: No murmur heard. Pulmonary:     Effort: Pulmonary effort is normal. No respiratory distress.     Breath sounds: Normal breath sounds.  Abdominal:     Palpations: Abdomen is soft.     Tenderness: There is no abdominal tenderness.  Musculoskeletal:     Cervical back: Neck supple.  Skin:    General: Skin is warm and dry.     Capillary Refill: Capillary refill takes less than 2 seconds.  Neurological:     Mental Status: He is alert and oriented to person, place, and time.  Psychiatric:        Mood and Affect: Mood normal.     ____________________________________________   LABS (all labs ordered are listed, but only abnormal results are displayed)  Labs Reviewed - No data to display ____________________________________________  EKG  ____________________________________________  RADIOLOGY  ED MD interpretation: No focal consolidation, large effusion, significant IMA, pneumothorax or any other clear acute intrathoracic process.  Official radiology report(s): DG Chest Portable 1 View  Result Date: 08/28/2020 CLINICAL DATA:  Cough.  Recent positive COVID-19 test. EXAM: PORTABLE CHEST 1 VIEW COMPARISON:  None. FINDINGS: Normal sized heart. Clear lungs. Minimal peribronchial thickening. Mild scoliosis. IMPRESSION: Minimal bronchitic changes. Electronically Signed   By: Beckie Salts M.D.   On: 08/28/2020 14:30    ____________________________________________   PROCEDURES  Procedure(s) performed (including Critical Care):  Procedures   ____________________________________________   INITIAL IMPRESSION / ASSESSMENT AND PLAN / ED COURSE      Patient presents with above-stated history exam for approximately 1 week of some shortness of breath congestion cough fatigue and headaches.  On arrival he is hypertensive with a BP of 155/1 6 with stable vital signs on room air.  Overall I suspect symptoms are all related to ongoing COVID bronchitis with component of enteritis  given he endorses some diarrhea.  He does not appear septic or meningitic and Evalose patient for clinically significant dehydration.  He is PERC negative and low suspicion for PE.  Overall given he denies any acute chest pain without clear risk factors and I have a low suspicion for ACS.  Unclear if still elevated blood pressure/acute illness although advised him in writing he should have this rechecked by his PCP.  No evidence of hypoxia.  Will write short course of Tessalon.  Discharged stable condition.  Strict return precautions advised and discussed.  Will defer Paxil but given onset of symptoms greater than  72 hours prior to presentation with concha mitten GI symptoms.        ____________________________________________   FINAL CLINICAL IMPRESSION(S) / ED DIAGNOSES  Final diagnoses:  COVID  Hypertension, unspecified type    Medications - No data to display   ED Discharge Orders          Ordered    benzonatate (TESSALON PERLES) 100 MG capsule  3 times daily PRN        08/28/20 1413             Note:  This document was prepared using Dragon voice recognition software and may include unintentional dictation errors.    Gilles Chiquito, MD 08/28/20 [phone]

## 2020-08-31 ENCOUNTER — Telehealth: Payer: Self-pay

## 2020-08-31 NOTE — Telephone Encounter (Signed)
Went to ER recently for Covid. Left message to find out how he was doing and to find out if he still considers Dr Alphonsus Sias his PCP. If so, he needs to schedule an appt soon to re-establish as he has not been seen since 2015. Otherwise, I asked that he call and let us know if Dr Alphonsus Sias is not so we can remove him from the chart.

## 2022-10-09 IMAGING — DX DG CHEST 1V PORT
1 series · 1 of 1 positions shown · non-contrast
Comparison: None.

CLINICAL DATA: Cough.  Recent positive [id number] test.

EXAM:
PORTABLE CHEST 1 VIEW

[chest ap]
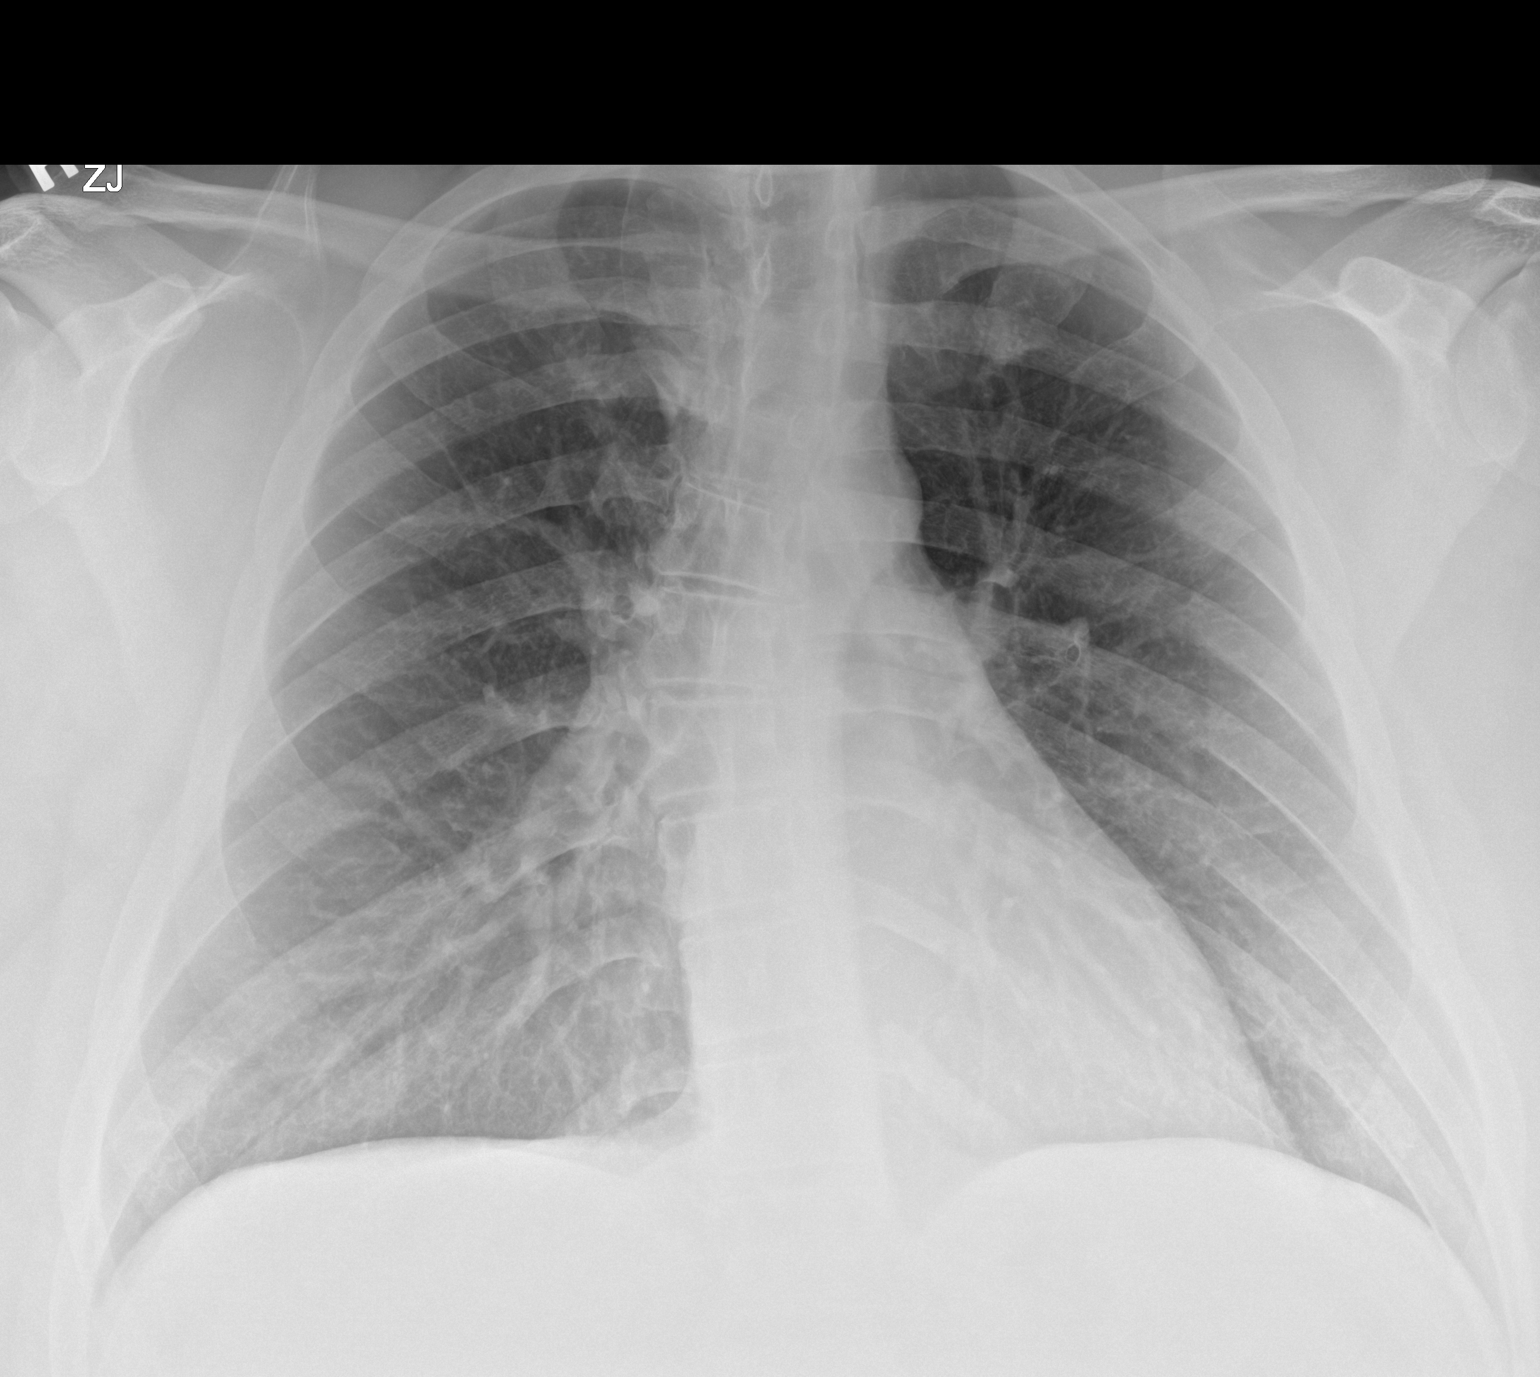

[1 of 1 positions shown; findings below may reference images not displayed]

FINDINGS: Normal sized heart. Clear lungs. Minimal peribronchial thickening.
Mild scoliosis.
IMPRESSION: Minimal bronchitic changes.

## 2022-12-22 ENCOUNTER — Other Ambulatory Visit: Payer: Self-pay

## 2022-12-22 ENCOUNTER — Emergency Department: Payer: 59

## 2022-12-22 ENCOUNTER — Emergency Department
Admission: EM | Admit: 2022-12-22 | Discharge: 2022-12-22 | Disposition: A | Discharge status: Home / Self Care | Payer: 59 | Attending: Emergency Medicine | Admitting: Emergency Medicine

## 2022-12-22 DIAGNOSIS — Z720 Tobacco use: Secondary | ICD-10-CM | POA: Diagnosis not present

## 2022-12-22 DIAGNOSIS — Z20822 Contact with and (suspected) exposure to covid-19: Secondary | ICD-10-CM | POA: Diagnosis not present

## 2022-12-22 DIAGNOSIS — J9801 Acute bronchospasm: Secondary | ICD-10-CM | POA: Diagnosis not present

## 2022-12-22 DIAGNOSIS — R062 Wheezing: Secondary | ICD-10-CM | POA: Diagnosis not present

## 2022-12-22 DIAGNOSIS — R0602 Shortness of breath: Secondary | ICD-10-CM | POA: Diagnosis not present

## 2022-12-22 LAB — CBC WITH DIFFERENTIAL/PLATELET
Abs Immature Granulocytes: 0.03 10*3/uL (ref 0.00–0.07)
Basophils Absolute: 0.1 10*3/uL (ref 0.0–0.1)
Basophils Relative: 1 %
Eosinophils Absolute: 0.6 10*3/uL — ABNORMAL HIGH (ref 0.0–0.5)
Eosinophils Relative: 6 %
HCT: 41.5 % (ref 39.0–52.0)
Hemoglobin: 14.4 g/dL (ref 13.0–17.0)
Immature Granulocytes: 0 %
Lymphocytes Relative: 31 %
Lymphs Abs: 3.3 10*3/uL (ref 0.7–4.0)
MCH: 30.9 pg (ref 26.0–34.0)
MCHC: 34.7 g/dL (ref 30.0–36.0)
MCV: 89.1 fL (ref 80.0–100.0)
Monocytes Absolute: 0.7 10*3/uL (ref 0.1–1.0)
Monocytes Relative: 7 %
Neutro Abs: 5.7 10*3/uL (ref 1.7–7.7)
Neutrophils Relative %: 55 %
Platelets: 371 10*3/uL (ref 150–400)
RBC: 4.66 MIL/uL (ref 4.22–5.81)
RDW: 11.9 % (ref 11.5–15.5)
WBC: 10.4 10*3/uL (ref 4.0–10.5)
nRBC: 0 % (ref 0.0–0.2)

## 2022-12-22 LAB — BASIC METABOLIC PANEL
Anion gap: 8 (ref 5–15)
BUN: 12 mg/dL (ref 6–20)
CO2: 24 mmol/L (ref 22–32)
Calcium: 8.7 mg/dL — ABNORMAL LOW (ref 8.9–10.3)
Chloride: 103 mmol/L (ref 98–111)
Creatinine, Ser: 1 mg/dL (ref 0.61–1.24)
GFR, Estimated: >60 mL/min (ref >60)
Glucose, Bld: 106 mg/dL — ABNORMAL HIGH (ref 70–99)
Potassium: 3.6 mmol/L (ref 3.5–5.1)
Sodium: 135 mmol/L (ref 135–145)

## 2022-12-22 LAB — RESP PANEL BY RT-PCR (RSV, FLU A&B, COVID)  RVPGX2
Influenza A by PCR: NEGATIVE
Influenza B by PCR: NEGATIVE
Resp Syncytial Virus by PCR: NEGATIVE
SARS Coronavirus 2 by RT PCR: NEGATIVE

## 2022-12-22 LAB — TROPONIN I (HIGH SENSITIVITY): Troponin I (High Sensitivity): 4 ng/L (ref <18)

## 2022-12-22 MED ORDER — IPRATROPIUM BROMIDE 0.02 % IN SOLN
0.5000 mg | Freq: Once | RESPIRATORY_TRACT | Status: AC
  Administered 2022-12-22: 0.5 mg via RESPIRATORY_TRACT
  Filled 2022-12-22: qty 2.5

## 2022-12-22 MED ORDER — PREDNISONE 20 MG PO TABS
60.0000 mg | ORAL_TABLET | Freq: Once | ORAL | Status: DC
  Filled 2022-12-22: qty 3

## 2022-12-22 MED ORDER — PREDNISONE 20 MG PO TABS: 60.0000 mg | ORAL_TABLET | Freq: Every day | ORAL | 0 refills | Status: DC

## 2022-12-22 MED ORDER — ALBUTEROL SULFATE HFA 108 (90 BASE) MCG/ACT IN AERS
2.0000 | INHALATION_SPRAY | Freq: Once | RESPIRATORY_TRACT | Status: AC
  Administered 2022-12-22: 2 via RESPIRATORY_TRACT
  Filled 2022-12-22: qty 6.7

## 2022-12-22 MED ORDER — METHYLPREDNISOLONE SODIUM SUCC 125 MG IJ SOLR
125.0000 mg | Freq: Once | INTRAMUSCULAR | Status: AC
  Administered 2022-12-22: 125 mg via INTRAVENOUS
  Filled 2022-12-22: qty 2

## 2022-12-22 MED ORDER — ALBUTEROL SULFATE HFA 108 (90 BASE) MCG/ACT IN AERS: 2.0000 | INHALATION_SPRAY | RESPIRATORY_TRACT | 0 refills | Status: DC | PRN

## 2022-12-22 MED ORDER — ALBUTEROL SULFATE (2.5 MG/3ML) 0.083% IN NEBU
5.0000 mg | INHALATION_SOLUTION | Freq: Once | RESPIRATORY_TRACT | Status: AC
  Administered 2022-12-22: 5 mg via RESPIRATORY_TRACT
  Filled 2022-12-22: qty 6

## 2022-12-22 NOTE — Discharge Instructions (Signed)
Your lab work was reassuring today.  Your chest x-ray was clear and showed no pneumonia.  Your COVID and flu swabs are negative.  I recommend that you stop smoking.  Please use your inhaler 2 puffs every 4 hours as needed for shortness of breath and wheezing.  Please take your steroids until complete.  We have placed a referral for primary care doctor.

## 2022-12-22 NOTE — ED Provider Notes (Signed)
[facility] Provider Note    Event Date/Time   First MD Initiated Contact with Patient 12/22/22 [phone]     (approximate)   History   Shortness of Breath   HPI  Jacob Flowers is a 37 y.o. male with history of tobacco use who presents to the emergency department with complaints of shortness of breath, wheezing, cough with white sputum production for the past 2 to 3 weeks.  Reports will have intermittent chest tightness.  No fever.  No history of PE, DVT, exogenous estrogen use, recent fractures, surgery, trauma, hospitalization, prolonged travel or other immobilization. No lower extremity swelling or pain. No calf tenderness.  He denies history of asthma but states his sister has asthma.  He has never had to use a breathing treatment.  States symptoms worsened tonight where he felt he could not go to sleep because of the wheezing and shortness of breath so he came to the emergency department.   History provided by patient.    No past medical history on file.  Past Surgical History:  Procedure Laterality Date   BREAST REDUCTION SURGERY  2/04    MEDICATIONS:  Prior to Admission medications   Medication Sig Start Date End Date Taking? Authorizing Provider  fluticasone (FLONASE) 50 MCG/ACT nasal spray Place 1 spray into both nostrils daily. 11/20/19 12/20/19  Lucy Chris, PA    Physical Exam   Triage Vital Signs: ED Triage Vitals  Encounter Vitals Group     BP 12/22/22 0519 (!) 177/104     Systolic BP Percentile --      Diastolic BP Percentile --      Pulse Rate 12/22/22 0519 89     Resp 12/22/22 0519 20     Temp 12/22/22 0519 98 F (36.7 C)     Temp Source 12/22/22 0519 Oral     SpO2 12/22/22 0519 94 %     Weight 12/22/22 0517 292 lb (132.5 kg)     Height 12/22/22 0517 5\' 11"  (1.803 m)     Head Circumference --      Peak Flow --      Pain Score 12/22/22 0518 4     Pain Loc --      Pain Education --      Exclude from Growth Chart --      Most recent vital signs: Vitals:   12/22/22 0519 12/22/22 0600  BP: (!) 177/104 (!) 142/97  Pulse: 89 88  Resp: 20 16  Temp: 98 F (36.7 C)   SpO2: 94% 100%    CONSTITUTIONAL: Alert, responds appropriately to questions. Well-appearing; well-nourished HEAD: Normocephalic, atraumatic EYES: Conjunctivae clear, pupils appear equal, sclera nonicteric ENT: normal nose; moist mucous membranes NECK: Supple, normal ROM CARD: RRR; S1 and S2 appreciated RESP: Normal chest excursion without splinting or tachypnea; breath sounds equal bilaterally.  Scattered inspiratory and expiratory wheezes.  Diminished aeration at bases.  No rhonchi or rales.  Speaking full sentences.  No hypoxia.  No increased work of breathing or respiratory distress. ABD/GI: Non-distended; soft, non-tender, no rebound, no guarding, no peritoneal signs BACK: The back appears normal EXT: Normal ROM in all joints; no deformity noted, no edema, no calf tenderness or calf swelling SKIN: Normal color for age and race; warm; no rash on exposed skin NEURO: Moves all extremities equally, normal speech PSYCH: The patient's mood and manner are appropriate.   ED Results / Procedures / Treatments   LABS: (all labs ordered are listed, but  only abnormal results are displayed) Labs Reviewed  CBC WITH DIFFERENTIAL/PLATELET - Abnormal; Notable for the following components:      Result Value   Eosinophils Absolute 0.6 (*)    All other components within normal limits  BASIC METABOLIC PANEL - Abnormal; Notable for the following components:   Glucose, Bld 106 (*)    Calcium 8.7 (*)    All other components within normal limits  RESP PANEL BY RT-PCR (RSV, FLU A&B, COVID)  RVPGX2  TROPONIN I (HIGH SENSITIVITY)     EKG:  EKG Interpretation Date/Time:  Thursday December 22 2022 05:22:10 EDT Ventricular Rate:  87 PR Interval:  148 QRS Duration:  117 QT Interval:  360 QTC Calculation: 433 R Axis:   46  Text  Interpretation: Sinus rhythm Nonspecific intraventricular conduction delay Inferior infarct, age indeterminate Lateral leads are also involved No old tracing to compare Confirmed by Rochele Raring [phone]) on 12/22/2022 5:24:26 AM         RADIOLOGY: My personal review and interpretation of imaging: Chest x-ray clear.  I have personally reviewed all radiology reports.   DG Chest Portable 1 View  Result Date: 12/22/2022 CLINICAL DATA:  37 year old male with history of shortness of breath and wheezing. EXAM: PORTABLE CHEST 1 VIEW COMPARISON:  Chest x-ray 08/29/2022. FINDINGS: Lung volumes are normal. No consolidative airspace disease. No pleural effusions. No pneumothorax. No pulmonary nodule or mass noted. Pulmonary vasculature and the cardiomediastinal silhouette are within normal limits. IMPRESSION: No radiographic evidence of acute cardiopulmonary disease. Electronically Signed   By: Trudie Reed M.D.   On: 12/22/2022 06:58     PROCEDURES:  Critical Care performed: No   CRITICAL CARE Performed by: Baxter Hire Landy Dunnavant   Total critical care time: 0 minutes  Critical care time was exclusive of separately billable procedures and treating other patients.  Critical care was necessary to treat or prevent imminent or life-threatening deterioration.  Critical care was time spent personally by me on the following activities: development of treatment plan with patient and/or surrogate as well as nursing, discussions with consultants, evaluation of patient's response to treatment, examination of patient, obtaining history from patient or surrogate, ordering and performing treatments and interventions, ordering and review of laboratory studies, ordering and review of radiographic studies, pulse oximetry and re-evaluation of patient's condition.   Marland Kitchen1-3 Lead EKG Interpretation  Performed by: Toshua Honsinger, Layla Maw, DO Authorized by: Cing Cotter, Layla Maw, DO     Interpretation: normal     ECG rate:  89    ECG rate assessment: normal     Rhythm: sinus rhythm     Ectopy: none     Conduction: normal       IMPRESSION / MDM / ASSESSMENT AND PLAN / ED COURSE  I reviewed the triage vital signs and the nursing notes.    Patient here with shortness of breath, wheezing.  The patient is on the cardiac monitor to evaluate for evidence of arrhythmia and/or significant heart rate changes.   DIFFERENTIAL DIAGNOSIS (includes but not limited to):   Bronchospasm, viral URI, pneumonia, doubt pneumothorax, CHF, ACS   Patient's presentation is most consistent with acute presentation with potential threat to life or bodily function.   PLAN: Will give breathing treatments, steroids.  Will obtain chest x-ray, COVID, flu and RSV swab.  EKG shows T wave inversions in inferior lateral leads with no old for comparison.  Symptoms seem atypical for ACS given wheezing but will obtain labs with 1 troponin.   MEDICATIONS GIVEN IN  ED: Medications  albuterol (PROVENTIL) (2.5 MG/3ML) 0.083% nebulizer solution 5 mg (5 mg Nebulization Given 12/22/22 0544)  ipratropium (ATROVENT) nebulizer solution 0.5 mg (0.5 mg Nebulization Given 12/22/22 0544)  methylPREDNISolone sodium succinate (SOLU-MEDROL) 125 mg/2 mL injection 125 mg (125 mg Intravenous Given 12/22/22 0543)  albuterol (PROVENTIL) (2.5 MG/3ML) 0.083% nebulizer solution 5 mg (5 mg Nebulization Given 12/22/22 0654)  ipratropium (ATROVENT) nebulizer solution 0.5 mg (0.5 mg Nebulization Given 12/22/22 0654)  albuterol (VENTOLIN HFA) 108 (90 Base) MCG/ACT inhaler 2 puff (2 puffs Inhalation Given 12/22/22 0654)     ED COURSE: Patient reports feeling much better after breathing treatments.  Chest x-ray reviewed and interpreted by myself and the radiologist and is clear.  COVID, flu and RSV negative.  Normal hemoglobin.  No leukocytosis.  Negative troponin.  Will discharge with albuterol inhaler and on a prednisone burst.  PCP referral has been placed.  Encouraged him  to stop smoking.   At this time, I do not feel there is any life-threatening condition present. I reviewed all nursing notes, vitals, pertinent previous records.  All lab and urine results, EKGs, imaging ordered have been independently reviewed and interpreted by myself.  I reviewed all available radiology reports from any imaging ordered this visit.  Based on my assessment, I feel the patient is safe to be discharged home without further emergent workup and can continue workup as an outpatient as needed. Discussed all findings, treatment plan as well as usual and customary return precautions.  They verbalize understanding and are comfortable with this plan.  Outpatient follow-up has been provided as needed.  All questions have been answered.    CONSULTS:  none   OUTSIDE RECORDS REVIEWED: Reviewed last family medicine visit and February 2015.       FINAL CLINICAL IMPRESSION(S) / ED DIAGNOSES   Final diagnoses:  Bronchospasm     Rx / DC Orders   ED Discharge Orders          Ordered    Ambulatory Referral to Primary Care (Establish Care)        12/22/22 0532    predniSONE (DELTASONE) 20 MG tablet  Daily        12/22/22 0708    albuterol (VENTOLIN HFA) 108 (90 Base) MCG/ACT inhaler  Every 4 hours PRN        12/22/22 0708             Note:  This document was prepared using Dragon voice recognition software and may include unintentional dictation errors.   Wilbert Schouten, Layla Maw, DO 12/22/22 [phone]

## 2022-12-22 NOTE — ED Triage Notes (Signed)
Patient ambulatory to room 10 with complaints of shortness of breath x a couple of weeks now but states he was unable to sleep tonight so wanted to get it checked out. Patient denies cough, fever, sore throat. Endorses feeling congested and 'tight'. EKG completed.

## 2023-02-15 ENCOUNTER — Ambulatory Visit: Payer: Self-pay | Admitting: Internal Medicine

## 2023-02-27 ENCOUNTER — Ambulatory Visit (INDEPENDENT_AMBULATORY_CARE_PROVIDER_SITE_OTHER): Payer: 59 | Admitting: Physician Assistant

## 2023-02-27 VITALS — BP 136/106 | HR 87 | Temp 98.1°F | Ht 71.0 in | Wt 291.0 lb

## 2023-02-27 DIAGNOSIS — R030 Elevated blood-pressure reading, without diagnosis of hypertension: Secondary | ICD-10-CM | POA: Diagnosis not present

## 2023-02-27 DIAGNOSIS — Z114 Encounter for screening for human immunodeficiency virus [HIV]: Secondary | ICD-10-CM | POA: Diagnosis not present

## 2023-02-27 DIAGNOSIS — F411 Generalized anxiety disorder: Secondary | ICD-10-CM | POA: Insufficient documentation

## 2023-02-27 DIAGNOSIS — Z1159 Encounter for screening for other viral diseases: Secondary | ICD-10-CM | POA: Diagnosis not present

## 2023-02-27 DIAGNOSIS — Z0000 Encounter for general adult medical examination without abnormal findings: Secondary | ICD-10-CM | POA: Diagnosis not present

## 2023-02-27 DIAGNOSIS — E66813 Obesity, class 3: Secondary | ICD-10-CM

## 2023-02-27 DIAGNOSIS — Z6841 Body Mass Index (BMI) 40.0 and over, adult: Secondary | ICD-10-CM | POA: Insufficient documentation

## 2023-02-27 DIAGNOSIS — F332 Major depressive disorder, recurrent severe without psychotic features: Secondary | ICD-10-CM | POA: Insufficient documentation

## 2023-02-27 DIAGNOSIS — Z131 Encounter for screening for diabetes mellitus: Secondary | ICD-10-CM

## 2023-02-27 DIAGNOSIS — J4520 Mild intermittent asthma, uncomplicated: Secondary | ICD-10-CM | POA: Diagnosis not present

## 2023-02-27 DIAGNOSIS — I10 Essential (primary) hypertension: Secondary | ICD-10-CM | POA: Insufficient documentation

## 2023-02-27 DIAGNOSIS — Z13220 Encounter for screening for lipoid disorders: Secondary | ICD-10-CM | POA: Diagnosis not present

## 2023-02-27 NOTE — Patient Instructions (Signed)

## 2023-02-27 NOTE — Progress Notes (Signed)
Date:  02/27/2023   Name:  Jacob Flowers   DOB:  [birth year]   MRN:  [id number]   Chief Complaint: Establish Care, Anxiety and Depression , and Hypertension  HPI Eissa is a pleasant 37 year old male with a history of anxiety, depression, mild asthma, obesity, and high blood pressure who presents new to the clinic today to establish care.  He has been lost to primary care for more than 10 years.  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  Last Physical: >10y ago Last Dental Exam: >10y ago. Brushes once daily in the a.m. and does not floss Last Eye Exam: >10y ago. Reports good distance vision, sometimes struggles near.   Only occasionally uses albuterol inhaler, thinks cold air is a trigger. States the inhaler helps.   Smokes cigarettes socially about 4 days per week, and <5 cigs each day.    Medication list has been reviewed and updated.  Current Meds  Medication Sig   albuterol (VENTOLIN HFA) 108 (90 Base) MCG/ACT inhaler Inhale 2 puffs into the lungs every 4 (four) hours as needed for wheezing or shortness of breath.     Review of Systems  Constitutional:  Negative for activity change, appetite change, fatigue and unexpected weight change.  HENT:  Negative for dental problem, hearing loss and trouble swallowing.   Eyes:  Negative for visual disturbance.  Respiratory:  Negative for cough, chest tightness, shortness of breath and wheezing.   Cardiovascular:  Negative for chest pain, palpitations and leg swelling.  Gastrointestinal:  Negative for abdominal distention, abdominal pain, blood in stool, constipation and diarrhea.  Endocrine: Negative for polydipsia, polyphagia and polyuria.  Genitourinary:  Negative for difficulty urinating, dysuria, enuresis, frequency, hematuria and testicular pain.  Musculoskeletal:  Negative for arthralgias, gait problem and joint swelling.  Skin:  Negative for rash and wound.  Neurological:  Negative for  dizziness, syncope, weakness and headaches.  Psychiatric/Behavioral:  Negative for behavioral problems and sleep disturbance.     There are no active problems to display for this patient.   No Known Allergies  Immunization History  Administered Date(s) Administered   Influenza Whole 01/27/2010   Td 10/02/2000, 01/25/2010    Past Surgical History:  Procedure Laterality Date   BREAST REDUCTION SURGERY  2/04    Social History   Tobacco Use   Smoking status: Some Days    Current packs/day: 0.25    Average packs/day: 0.3 packs/day for 14.0 years (3.5 ttl pk-yrs)    Types: Cigarettes    Start date: 2011   Smokeless tobacco: Never   Tobacco comments:    On and off  Vaping Use   Vaping status: Former   Start date: 03/07/2012   Quit date: 03/07/2022   Substances: THC   Devices: on and off  Substance Use Topics   Alcohol use: Yes    Comment: socially   Drug use: Yes    Types: Marijuana    Family History  Problem Relation Age of Onset   Diabetes Father    Asthma Sister    Diabetes Other    Cancer Other        breast cancer   Heart disease Neg Hx         02/27/2023   10:45 AM  GAD 7 : Generalized Anxiety Score  Nervous, Anxious, on Edge 3  Control/stop worrying 2  Worry too much - different things 3  Trouble relaxing 3  Restless 3  Easily  annoyed or irritable 3  Afraid - awful might happen 3  Total GAD 7 Score 20  Anxiety Difficulty Extremely difficult       02/27/2023   10:44 AM  Depression screen PHQ 2/9  Decreased Interest 3  Down, Depressed, Hopeless 3  PHQ - 2 Score 6  Altered sleeping 3  Tired, decreased energy 3  Change in appetite 3  Feeling bad or failure about yourself  3  Trouble concentrating 3  Moving slowly or fidgety/restless 3  Suicidal thoughts 1  PHQ-9 Score 25  Difficult doing work/chores Extremely dIfficult    BP Readings from Last 3 Encounters:  02/27/23 (!) 136/106  12/22/22 (!) 155/103  08/28/20 (!) 155/106    Wt  Readings from Last 3 Encounters:  02/27/23 291 lb (132 kg)  12/22/22 292 lb (132.5 kg)  08/28/20 300 lb (136.1 kg)    BP (!) 136/106 (BP Location: Left Arm, Patient Position: Sitting, Cuff Size: Large)   Pulse 87   Temp 98.1 F (36.7 C) (Oral)   Ht 5\' 11"  (1.803 m)   Wt 291 lb (132 kg)   SpO2 97%   BMI 40.59 kg/m   Physical Exam Vitals and nursing note reviewed.  Constitutional:      Appearance: Normal appearance.  HENT:     Ears:     Comments: EAC clear bilaterally with good view of TM which is without effusion or erythema.     Nose: Nose normal.     Mouth/Throat:     Mouth: Mucous membranes are moist. No oral lesions.     Dentition: Abnormal dentition (staining and plaque apparent). Dental caries present.     Pharynx: No posterior oropharyngeal erythema.     Tonsils: No tonsillar exudate. 3+ on the right. 4+ on the left.     Comments: Tonsils enlarged and cryptic bilaterally L>R Eyes:     Extraocular Movements: Extraocular movements intact.     Conjunctiva/sclera: Conjunctivae normal.     Pupils: Pupils are equal, round, and reactive to light.  Neck:     Thyroid: No thyromegaly.  Cardiovascular:     Rate and Rhythm: Normal rate and regular rhythm.     Heart sounds: No murmur heard.    No friction rub. No gallop.     Comments: Pulses 2+ at radial, PT, DP bilaterally. No carotid bruit. No peripheral edema Pulmonary:     Effort: Pulmonary effort is normal.     Breath sounds: Normal breath sounds.  Abdominal:     General: Bowel sounds are normal.     Palpations: Abdomen is soft. There is no mass.     Tenderness: There is no abdominal tenderness.  Genitourinary:    Comments: Genital/rectal exam deferred. Reviewed technique for testicular self-exam. Musculoskeletal:     Comments: Full ROM with strength 5/5 bilateral upper and lower extremities  Lymphadenopathy:     Cervical: No cervical adenopathy.  Skin:    General: Skin is warm.     Capillary Refill: Capillary  refill takes less than 2 seconds.     Findings: No lesion or rash.  Neurological:     Mental Status: He is alert and oriented to person, place, and time.     Gait: Gait is intact.  Psychiatric:        Mood and Affect: Mood normal.        Behavior: Behavior normal.     Recent Labs     Component Value Date/Time   NA 135 12/22/2022 0541  K 3.6 12/22/2022 0541   CL 103 12/22/2022 0541   CO2 24 12/22/2022 0541   GLUCOSE 106 (H) 12/22/2022 0541   BUN 12 12/22/2022 0541   CREATININE 1.00 12/22/2022 0541   CALCIUM 8.7 (L) 12/22/2022 0541   GFRNONAA >60 12/22/2022 0541    Lab Results  Component Value Date   WBC 10.4 12/22/2022   HGB 14.4 12/22/2022   HCT 41.5 12/22/2022   MCV 89.1 12/22/2022   PLT 371 12/22/2022   No results found for: "HGBA1C" No results found for: "CHOL", "HDL", "LDLCALC", "LDLDIRECT", "TRIG", "CHOLHDL" No results found for: "TSH"   Assessment and Plan:  1. Annual physical exam (Primary) Encouraged healthy lifestyle including regular physical activity and consumption of whole fruits and vegetables. Encouraged routine dental and eye exams.  Check baseline labs, ordered today, but patient will return for these at a later date.  Significant tonsillar hypertrophy bilaterally discussed with patient.  Does not seem to be particularly bothersome at this time.  Prioritize oral hygiene, consider ENT referral if problematic in the future.  - CBC with Differential/Platelet - Comprehensive metabolic panel - TSH - Hemoglobin A1c - Lipid panel - Hepatitis C antibody - HIV Antibody (routine testing w rflx)  2. Severe episode of recurrent major depressive disorder, without psychotic features [facility]) Discussed with patient.  He would like to avoid pharmacotherapy for now.  Offered to make behavioral health referral, but patient has opted to find a therapist and later let me know where he would like the referral to go.   We discussed the importance of routine physical  activity in reducing stress and anxiety.  May find benefit from yoga, mindfulness, and breathing techniques including box breathing as nonpharmacologic options for reducing anxiety.  3. Generalized anxiety disorder Plan as above  4. Elevated blood pressure reading in office without diagnosis of hypertension Patient has a blood pressure cuff at home but does not monitor his blood pressure routinely.  Encouraged home BP monitoring.  No pharmacotherapy today, but plan for short interval follow-up with review of BP log.  5. Class 3 severe obesity with body mass index (BMI) of 40.0 to 44.9 in adult, unspecified obesity type, unspecified whether serious comorbidity present [facility]) Not discussed at length today.  Check for weight related comorbidities - CBC with Differential/Platelet - Comprehensive metabolic panel - TSH - Hemoglobin A1c - Lipid panel  6. Mild intermittent asthma without complication Well-managed with albuterol as needed.  Continue.  7. Screening for diabetes mellitus - Hemoglobin A1c  8. Screening for hyperlipidemia - Lipid panel  9. Screening for HIV (human immunodeficiency virus) - HIV Antibody (routine testing w rflx)  10. Need for hepatitis C screening test - Hepatitis C antibody   Return in about 3 weeks (around 03/20/2023) for OV f/u HTN.    Alvester Morin, PA-C, DMSc, Nutritionist [facility] [phone]

## 2023-03-06 ENCOUNTER — Telehealth: Payer: Self-pay | Admitting: Physician Assistant

## 2023-03-06 NOTE — Telephone Encounter (Signed)
LVM as friendly reminder to get labs done.

## 2023-03-21 ENCOUNTER — Encounter: Payer: Self-pay | Admitting: Physician Assistant

## 2023-03-21 ENCOUNTER — Ambulatory Visit: Payer: 59 | Admitting: Physician Assistant

## 2023-03-21 VITALS — BP 154/110 | HR 83 | Temp 98.2°F | Ht 71.0 in | Wt 296.0 lb

## 2023-03-21 DIAGNOSIS — J4520 Mild intermittent asthma, uncomplicated: Secondary | ICD-10-CM

## 2023-03-21 DIAGNOSIS — Z0000 Encounter for general adult medical examination without abnormal findings: Secondary | ICD-10-CM | POA: Diagnosis not present

## 2023-03-21 DIAGNOSIS — F332 Major depressive disorder, recurrent severe without psychotic features: Secondary | ICD-10-CM | POA: Diagnosis not present

## 2023-03-21 DIAGNOSIS — J351 Hypertrophy of tonsils: Secondary | ICD-10-CM | POA: Insufficient documentation

## 2023-03-21 DIAGNOSIS — I10 Essential (primary) hypertension: Secondary | ICD-10-CM | POA: Diagnosis not present

## 2023-03-21 MED ORDER — ALBUTEROL SULFATE HFA 108 (90 BASE) MCG/ACT IN AERS: 2.0000 | INHALATION_SPRAY | RESPIRATORY_TRACT | 11 refills | Status: AC | PRN

## 2023-03-21 MED ORDER — CHLORTHALIDONE 25 MG PO TABS: 12.5000 mg | ORAL_TABLET | Freq: Every day | ORAL | 1 refills | Status: AC

## 2023-03-21 NOTE — Progress Notes (Signed)
 Date:  03/21/2023   Name:  Jacob Flowers   DOB:  07/16/1985   MRN:  [id number]   Chief Complaint: Hypertension  Hypertension   Joakim presents for 3-week follow-up on chronic conditions, mainly HTN which was elevated last visit x 2.  He was advised to obtain a blood pressure cuff and monitor at home, but unfortunately he forgot to do this.  Last visit we also discussed MDD and GAD, offered medication at the time, but declined.  He was advised to seek a counselor or therapist, which he has not done.  Labs ordered last visit have also not been completed, but he plans to do this today.  Thankfully, as of last week, he quit smoking cigarettes and is proud of this.   Medication list has been reviewed and updated.  Current Meds  Medication Sig   chlorthalidone  (HYGROTON ) 25 MG tablet Take 0.5 tablets (12.5 mg total) by mouth daily.   [DISCONTINUED] albuterol  (VENTOLIN  HFA) 108 (90 Base) MCG/ACT inhaler Inhale 2 puffs into the lungs every 4 (four) hours as needed for wheezing or shortness of breath.     Review of Systems  Patient Active Problem List   Diagnosis Date Noted   Chronic tonsillar hypertrophy 03/21/2023   Hypertension 02/27/2023   Class 3 severe obesity with body mass index (BMI) of 40.0 to 44.9 in adult (HCC) 02/27/2023   Mild intermittent asthma without complication 02/27/2023   Generalized anxiety disorder 02/27/2023   Severe episode of recurrent major depressive disorder, without psychotic features (HCC) 02/27/2023    No Known Allergies  Immunization History  Administered Date(s) Administered   Influenza Whole 01/27/2010   Td 10/02/2000, 01/25/2010    Past Surgical History:  Procedure Laterality Date   BREAST REDUCTION SURGERY  2/04    Social History   Tobacco Use   Smoking status: Former    Current packs/day: 0.00    Average packs/day: 0.3 packs/day for 14.0 years (3.5 ttl pk-yrs)    Types: Cigarettes    Start date: 2011    Quit date:  03/14/2023    Years since quitting: 0.0   Smokeless tobacco: Never   Tobacco comments:    On and off  Vaping Use   Vaping status: Former   Start date: 03/07/2012   Quit date: 03/07/2022   Substances: THC   Devices: on and off  Substance Use Topics   Alcohol use: Yes    Comment: socially   Drug use: Yes    Types: Marijuana    Family History  Problem Relation Age of Onset   Diabetes Father    Asthma Sister    Diabetes Other    Cancer Other        breast cancer   Heart disease Neg Hx         03/21/2023   10:48 AM 02/27/2023   10:45 AM  GAD 7 : Generalized Anxiety Score  Nervous, Anxious, on Edge 2 3  Control/stop worrying 2 2  Worry too much - different things 2 3  Trouble relaxing 2 3  Restless 1 3  Easily annoyed or irritable 1 3  Afraid - awful might happen 2 3  Total GAD 7 Score 12 20  Anxiety Difficulty Somewhat difficult Extremely difficult       03/21/2023   10:46 AM 02/27/2023   10:44 AM  Depression screen PHQ 2/9  Decreased Interest 1 3  Down, Depressed, Hopeless 1 3  PHQ - 2 Score 2 6  Altered sleeping 1 3  Tired, decreased energy 3 3  Change in appetite 1 3  Feeling bad or failure about yourself  2 3  Trouble concentrating 1 3  Moving slowly or fidgety/restless 1 3  Suicidal thoughts 0 1  PHQ-9 Score 11 25  Difficult doing work/chores Somewhat difficult Extremely dIfficult    BP Readings from Last 3 Encounters:  03/21/23 (!) 154/110  02/27/23 (!) 136/106  12/22/22 (!) 155/103    Wt Readings from Last 3 Encounters:  03/21/23 296 lb (134.3 kg)  02/27/23 291 lb (132 kg)  12/22/22 292 lb (132.5 kg)    BP (!) 154/110 (BP Location: Left Arm, Patient Position: Sitting, Cuff Size: Large)   Pulse 83   Temp 98.2 F (36.8 C)   Ht 5' 11 (1.803 m)   Wt 296 lb (134.3 kg)   SpO2 96%   BMI 41.28 kg/m   Physical Exam Vitals and nursing note reviewed.  Constitutional:      Appearance: Normal appearance.  Cardiovascular:     Rate and Rhythm:  Normal rate.  Pulmonary:     Effort: Pulmonary effort is normal.  Abdominal:     General: There is no distension.  Musculoskeletal:        General: Normal range of motion.  Skin:    General: Skin is warm and dry.  Neurological:     Mental Status: He is alert and oriented to person, place, and time.     Gait: Gait is intact.  Psychiatric:        Mood and Affect: Mood and affect normal.     Recent Labs     Component Value Date/Time   NA 135 12/22/2022 0541   K 3.6 12/22/2022 0541   CL 103 12/22/2022 0541   CO2 24 12/22/2022 0541   GLUCOSE 106 (H) 12/22/2022 0541   BUN 12 12/22/2022 0541   CREATININE 1.00 12/22/2022 0541   CALCIUM 8.7 (L) 12/22/2022 0541   GFRNONAA >60 12/22/2022 0541    Lab Results  Component Value Date   WBC 10.4 12/22/2022   HGB 14.4 12/22/2022   HCT 41.5 12/22/2022   MCV 89.1 12/22/2022   PLT 371 12/22/2022   No results found for: HGBA1C No results found for: CHOL, HDL, LDLCALC, LDLDIRECT, TRIG, CHOLHDL No results found for: TSH   Assessment and Plan:  1. Hypertension, unspecified type (Primary) Blood pressure remains elevated x 2 in clinic.  Discussed with patient will begin pharmacotherapy at this time, starting with chlorthalidone  as below.  Advised to start with half tablets.  Discussed the importance of home BP monitoring.  Reviewed the Seven S's of hypertension including salt, smoking, stimulants (e.g. caffeine), stress, sleep, snoring (OSA), sedentary lifestyle.  - chlorthalidone  (HYGROTON ) 25 MG tablet; Take 0.5 tablets (12.5 mg total) by mouth daily.  Dispense: 30 tablet; Refill: 1  2. Mild intermittent asthma without complication Refill albuterol  inhaler as below - albuterol  (VENTOLIN  HFA) 108 (90 Base) MCG/ACT inhaler; Inhale 2 puffs into the lungs every 4 (four) hours as needed for wheezing or shortness of breath.  Dispense: 1 each; Refill: 11  3. Severe episode of recurrent major depressive disorder, without  psychotic features [facility]) Discussed with patient again today.  Thankfully, his GAD-7 and PHQ-9 scores have dramatically improved over the last few weeks without any specific intervention.  Again offered pharmacotherapy, but declined at this time.  Encouraged to seek behavioral health care as previously discussed.  Happy to submit referral at any time, but  patient wants to find his own counselor/therapist.    Return in about 4 weeks (around 04/18/2023).    Rolan Hoyle, PA-C, DMSc, Nutritionist [facility] [phone]

## 2023-03-21 NOTE — Patient Instructions (Signed)
-  It was a pleasure to see you today! Please review your visit summary for helpful information -Lab results are usually available within 1-2 days and we will call once reviewed -I would encourage you to follow your care via MyChart where you can access lab results, notes, messages, and more -If you feel that we did a nice job today, please complete your after-visit survey and leave [place] a Google review! Your CMA today was Mariann Barter and your provider was Alvester Morin, PA-C, DMSc -Please return for follow-up in about 1 month  Reviewed the "Seven S's" of hypertension including salt, smoking, stimulants (e.g. caffeine), stress, sleep, snoring (OSA), sedentary lifestyle.

## 2023-03-22 ENCOUNTER — Encounter: Payer: Self-pay | Admitting: Physician Assistant

## 2023-03-22 DIAGNOSIS — E8881 Metabolic syndrome: Secondary | ICD-10-CM | POA: Insufficient documentation

## 2023-03-22 DIAGNOSIS — E785 Hyperlipidemia, unspecified: Secondary | ICD-10-CM | POA: Insufficient documentation

## 2023-03-22 LAB — CBC WITH DIFFERENTIAL/PLATELET
Basophils Absolute: 0.1 10*3/uL (ref 0.0–0.2)
Basos: 1 %
EOS (ABSOLUTE): 0.6 10*3/uL — ABNORMAL HIGH (ref 0.0–0.4)
Eos: 7 %
Hematocrit: 45.7 % (ref 37.5–51.0)
Hemoglobin: 15.7 g/dL (ref 13.0–17.7)
Immature Grans (Abs): 0 10*3/uL (ref 0.0–0.1)
Immature Granulocytes: 0 %
Lymphocytes Absolute: 3 10*3/uL (ref 0.7–3.1)
Lymphs: 33 %
MCH: 31 pg (ref 26.6–33.0)
MCHC: 34.4 g/dL (ref 31.5–35.7)
MCV: 90 fL (ref 79–97)
Monocytes Absolute: 0.7 10*3/uL (ref 0.1–0.9)
Monocytes: 8 %
Neutrophils Absolute: 4.8 10*3/uL (ref 1.4–7.0)
Neutrophils: 51 %
Platelets: 333 10*3/uL (ref 150–450)
RBC: 5.07 x10E6/uL (ref 4.14–5.80)
RDW: 12.6 % (ref 11.6–15.4)
WBC: 9.2 10*3/uL (ref 3.4–10.8)

## 2023-03-22 LAB — COMPREHENSIVE METABOLIC PANEL
ALT: 23 [IU]/L (ref 0–44)
AST: 20 [IU]/L (ref 0–40)
Albumin: 4.6 g/dL (ref 4.1–5.1)
Alkaline Phosphatase: 90 [IU]/L (ref 44–121)
BUN/Creatinine Ratio: 10 (ref 9–20)
BUN: 10 mg/dL (ref 6–20)
Bilirubin Total: <0.2 mg/dL (ref 0.0–1.2)
CO2: 21 mmol/L (ref 20–29)
Calcium: 9.8 mg/dL (ref 8.7–10.2)
Chloride: 103 mmol/L (ref 96–106)
Creatinine, Ser: 0.99 mg/dL (ref 0.76–1.27)
Globulin, Total: 3.2 g/dL (ref 1.5–4.5)
Glucose: 81 mg/dL (ref 70–99)
Potassium: 4.6 mmol/L (ref 3.5–5.2)
Sodium: 140 mmol/L (ref 134–144)
Total Protein: 7.8 g/dL (ref 6.0–8.5)
eGFR: 101 mL/min/{1.73_m2} (ref >59)

## 2023-03-22 LAB — HEMOGLOBIN A1C
Est. average glucose Bld gHb Est-mCnc: 108 mg/dL
Hgb A1c MFr Bld: 5.4 % (ref 4.8–5.6)

## 2023-03-22 LAB — LIPID PANEL
Chol/HDL Ratio: 5.6 {ratio} — ABNORMAL HIGH (ref 0.0–5.0)
Cholesterol, Total: 197 mg/dL (ref 100–199)
HDL: 35 mg/dL — ABNORMAL LOW (ref >39)
LDL Chol Calc (NIH): 125 mg/dL — ABNORMAL HIGH (ref 0–99)
Triglycerides: 206 mg/dL — ABNORMAL HIGH (ref 0–149)
VLDL Cholesterol Cal: 37 mg/dL (ref 5–40)

## 2023-03-22 LAB — HIV ANTIBODY (ROUTINE TESTING W REFLEX): HIV Screen 4th Generation wRfx: NONREACTIVE

## 2023-03-22 LAB — TSH: TSH: 1.88 u[IU]/mL (ref 0.450–4.500)

## 2023-03-22 LAB — HEPATITIS C ANTIBODY: Hep C Virus Ab: NONREACTIVE

## 2023-04-18 ENCOUNTER — Ambulatory Visit: Payer: Self-pay | Admitting: Physician Assistant

## 2023-12-01 ENCOUNTER — Telehealth: Payer: Self-pay

## 2023-12-01 NOTE — Telephone Encounter (Signed)
Please call pt to schedule an appt for HTN.  KP

## 2024-03-04 ENCOUNTER — Encounter: Payer: Self-pay | Admitting: Physician Assistant
